# Patient Record
Sex: Male | Born: 1985 | Race: Black or African American | Hispanic: No | Marital: Married | Smoking: Never smoker
Health system: Southern US, Community
[De-identification: ages and names within clinical notes are randomized; demographics above are authoritative.]

## PROBLEM LIST (undated history)

## (undated) DIAGNOSIS — I10 Essential (primary) hypertension: Secondary | ICD-10-CM

## (undated) DIAGNOSIS — I493 Ventricular premature depolarization: Secondary | ICD-10-CM

---

## 2011-09-23 ENCOUNTER — Encounter (HOSPITAL_COMMUNITY): Payer: Self-pay | Admitting: *Deleted

## 2011-09-23 ENCOUNTER — Emergency Department (HOSPITAL_COMMUNITY)
Admission: EM | Admit: 2011-09-23 | Discharge: 2011-09-23 | Disposition: A | Discharge status: Home / Self Care | Payer: No Typology Code available for payment source | Attending: Emergency Medicine | Admitting: Emergency Medicine

## 2011-09-23 ENCOUNTER — Emergency Department (HOSPITAL_COMMUNITY): Payer: No Typology Code available for payment source

## 2011-09-23 DIAGNOSIS — T148XXA Other injury of unspecified body region, initial encounter: Secondary | ICD-10-CM

## 2011-09-23 DIAGNOSIS — Z043 Encounter for examination and observation following other accident: Secondary | ICD-10-CM | POA: Insufficient documentation

## 2011-09-23 DIAGNOSIS — IMO0001 Reserved for inherently not codable concepts without codable children: Secondary | ICD-10-CM | POA: Insufficient documentation

## 2011-09-23 LAB — URINALYSIS, ROUTINE W REFLEX MICROSCOPIC
Glucose, UA: NEGATIVE mg/dL
Ketones, ur: NEGATIVE mg/dL
Leukocytes, UA: NEGATIVE
Nitrite: NEGATIVE
Protein, ur: NEGATIVE mg/dL
Urobilinogen, UA: 0.2 mg/dL (ref 0.0–1.0)

## 2011-09-23 MED ORDER — CYCLOBENZAPRINE HCL 10 MG PO TABS: 10.0000 mg | ORAL_TABLET | Freq: Two times a day (BID) | ORAL | Status: AC | PRN

## 2011-09-23 MED ORDER — HYDROCODONE-ACETAMINOPHEN 5-325 MG PO TABS: 1.0000 | ORAL_TABLET | ORAL | Status: AC | PRN

## 2011-09-23 MED ORDER — IBUPROFEN 800 MG PO TABS: 800.0000 mg | ORAL_TABLET | Freq: Three times a day (TID) | ORAL | Status: AC

## 2011-09-23 NOTE — ED Notes (Signed)
Patient given discharge instructions, information, prescriptions, and diet order. Patient states that they adequately understand discharge information given and to return to ED if symptoms return or worsen.     
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
## 2011-09-24 NOTE — ED Provider Notes (Signed)
Medical screening examination/treatment/procedure(s) were performed by non-physician practitioner and as supervising physician I was immediately available for consultation/collaboration.   Kailany Dinunzio M Jigar Zielke, MD 09/24/11 0113 

## 2016-05-16 ENCOUNTER — Emergency Department (HOSPITAL_COMMUNITY)
Admission: EM | Admit: 2016-05-16 | Discharge: 2016-05-16 | Disposition: A | Discharge status: Home / Self Care | Payer: 59 | Attending: Emergency Medicine | Admitting: Emergency Medicine

## 2016-05-16 ENCOUNTER — Encounter (HOSPITAL_COMMUNITY): Payer: Self-pay | Admitting: Emergency Medicine

## 2016-05-16 ENCOUNTER — Emergency Department (HOSPITAL_COMMUNITY): Payer: 59

## 2016-05-16 DIAGNOSIS — M542 Cervicalgia: Secondary | ICD-10-CM | POA: Insufficient documentation

## 2016-05-16 DIAGNOSIS — Z79899 Other long term (current) drug therapy: Secondary | ICD-10-CM | POA: Insufficient documentation

## 2016-05-16 DIAGNOSIS — R0789 Other chest pain: Secondary | ICD-10-CM | POA: Insufficient documentation

## 2016-05-16 DIAGNOSIS — I10 Essential (primary) hypertension: Secondary | ICD-10-CM | POA: Diagnosis not present

## 2016-05-16 DIAGNOSIS — M25512 Pain in left shoulder: Secondary | ICD-10-CM | POA: Diagnosis not present

## 2016-05-16 DIAGNOSIS — R079 Chest pain, unspecified: Secondary | ICD-10-CM | POA: Diagnosis present

## 2016-05-16 HISTORY — DX: Essential (primary) hypertension: I10

## 2016-05-16 HISTORY — DX: Ventricular premature depolarization: I49.3

## 2016-05-16 LAB — CBC
HCT: 46.4 % (ref 39.0–52.0)
Hemoglobin: 15.3 g/dL (ref 13.0–17.0)
MCH: 23.9 pg — AB (ref 26.0–34.0)
MCHC: 33 g/dL (ref 30.0–36.0)
MCV: 72.6 fL — AB (ref 78.0–100.0)
Platelets: 234 10*3/uL (ref 150–400)
RBC: 6.39 MIL/uL — AB (ref 4.22–5.81)
RDW: 14.9 % (ref 11.5–15.5)
WBC: 8.2 10*3/uL (ref 4.0–10.5)

## 2016-05-16 LAB — BASIC METABOLIC PANEL
Anion gap: 12 (ref 5–15)
BUN: 13 mg/dL (ref 6–20)
CHLORIDE: 101 mmol/L (ref 101–111)
CO2: 27 mmol/L (ref 22–32)
Calcium: 9.3 mg/dL (ref 8.9–10.3)
Creatinine, Ser: 1.04 mg/dL (ref 0.61–1.24)
GFR calc non Af Amer: >60 mL/min (ref >60)
Glucose, Bld: 84 mg/dL (ref 65–99)
POTASSIUM: 3.6 mmol/L (ref 3.5–5.1)
SODIUM: 140 mmol/L (ref 135–145)

## 2016-05-16 LAB — TROPONIN I: Troponin I: <0.03 ng/mL (ref <0.03)

## 2016-05-16 MED ORDER — IBUPROFEN 600 MG PO TABS: 600.0000 mg | ORAL_TABLET | Freq: Four times a day (QID) | ORAL | 0 refills | Status: AC | PRN

## 2016-05-16 NOTE — ED Triage Notes (Signed)
Pt c/o 6/10 left side cp radiating to left arm and neck that started today, denies any SOB, nausea or vomiting. Pt Ao x4 NAD noticed.

## 2016-05-16 NOTE — Discharge Instructions (Signed)
Take Ibuprofen three times daily as needed Follow up with your doctor

## 2016-05-16 NOTE — ED Provider Notes (Signed)
[facility]-EMERGENCY DEPT Provider Note   CSN: [id number] Arrival date & time: 05/16/16  0325     History   Chief Complaint Chief Complaint  Patient presents with  . Chest Pain    HPI Aaron Clayton is a 31 y.o. male who presents with chest pain, neck pain, L arm pain. PMH significant for obesity and hx of PVCs. He states that yesterday he first noticed the pain at about 2PM while he was standing in line at the store. The pain is mostly on the left upper side of his chest, left neck, and inside of his left upper arm. It is achy but sometimes sharp as well. Lying down and movement make the pain better. Nothing has made it worse. He has not tried any medicines. No fever, cough, SOB, palpitations, leg swelling, abdominal pain, N/V. His mother has hx of heart murmur and HTN and his father has heart disease. 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  HPI  Past Medical History:  Diagnosis Date  . Hypertension   . PVC (premature ventricular contraction)     There are no active problems to display for this patient.   History reviewed. No pertinent surgical history.     Home Medications    Prior to Admission medications   Medication Sig Start Date End Date Taking? Authorizing Provider  acetaminophen (TYLENOL) 500 MG tablet Take 500 mg by mouth every 6 (six) hours as needed for mild pain.    Yes Historical Provider, MD  cetirizine (ZYRTEC) 10 MG tablet Take 10 mg by mouth daily as needed for allergies.   Yes Historical Provider, MD  diphenhydrAMINE (BENADRYL) 25 mg capsule Take 25 mg by mouth every 6 (six) hours as needed for allergies.    Yes Historical Provider, MD  loratadine (CLARITIN) 10 MG tablet Take 10 mg by mouth daily as needed for allergies.    Yes Historical Provider, MD    Family History History reviewed. No pertinent family history.  Social History Social History  Substance Use Topics  . Smoking status: Never Smoker  . Smokeless tobacco: Never Used  .  Alcohol use Yes     Comment: socially     Allergies   Patient has no known allergies.   Review of Systems Review of Systems  Constitutional: Negative for chills and fever.  Respiratory: Negative for shortness of breath.   Cardiovascular: Positive for chest pain. Negative for palpitations and leg swelling.  Gastrointestinal: Negative for abdominal pain, nausea and vomiting.  Musculoskeletal: Positive for myalgias and neck pain.  All other systems reviewed and are negative.   Physical Exam Updated Vital Signs BP 124/79 (BP Location: Right Arm)   Pulse 62   Temp 98.2 F (36.8 C) (Oral)   Resp (!) 22   Ht 6' (1.829 m)   Wt 133.8 kg   SpO2 99%   BMI 40.01 kg/m   Physical Exam  Constitutional: He is oriented to person, place, and time. He appears well-developed and well-nourished. No distress.  HENT:  Head: Normocephalic and atraumatic.  Eyes: Conjunctivae are normal. Pupils are equal, round, and reactive to light. Right eye exhibits no discharge. Left eye exhibits no discharge. No scleral icterus.  Neck: Normal range of motion.  Left sided neck tenderness  Cardiovascular: Normal rate and regular rhythm.  Exam reveals no gallop and no friction rub.   No murmur heard. Pulmonary/Chest: Effort normal and breath sounds normal. No respiratory distress. He has no wheezes. He  has no rales. He exhibits tenderness (Tender to left upper chest).  Abdominal: Soft. Bowel sounds are normal. He exhibits no distension and no mass. There is no tenderness. There is no rebound and no guarding. No hernia.  Musculoskeletal:  Left anterior shoulder and arm tenderness. FROM of shoulder and elbow. 2+ radial pulse bilaterally  Neurological: He is alert and oriented to person, place, and time.  Skin: Skin is warm and dry.  Psychiatric: He has a normal mood and affect. His behavior is normal.  Nursing note and vitals reviewed.   ED Treatments / Results  Labs (all labs ordered are listed, but only  abnormal results are displayed) Labs Reviewed  CBC - Abnormal; Notable for the following:       Result Value   RBC 6.39 (*)    MCV 72.6 (*)    MCH 23.9 (*)    All other components within normal limits  BASIC METABOLIC PANEL  TROPONIN I    EKG  EKG Interpretation  Date/Time:  Saturday May 16 2016 03:40:42 EDT Ventricular Rate:  81 PR Interval:  174 QRS Duration: 104 QT Interval:  382 QTC Calculation: 443 R Axis:   6 Text Interpretation:  Normal sinus rhythm Normal ECG No old tracing to compare Confirmed by GOLDSTON MD, SCOTT [phone]) on 05/16/2016 5:57:11 AM       Radiology Dg Chest 2 View  Result Date: 05/16/2016 CLINICAL DATA:  LEFT neck pain radiating to arm for a few hours. Chest pain. EXAM: CHEST  2 VIEW COMPARISON:  Chest radiograph September 23, 2011 FINDINGS: Cardiomediastinal silhouette is normal. No pleural effusions or focal consolidations. Trachea projects midline and there is no pneumothorax. Soft tissue planes and included osseous structures are non-suspicious. Large body habitus. IMPRESSION: Normal chest. Electronically Signed   By: Awilda Metro M.D.   On: 05/16/2016 04:00    Procedures Procedures (including critical care time)  Medications Ordered in ED Medications - No data to display   Initial Impression / Assessment and Plan / ED Course  I have reviewed the triage vital signs and the nursing notes.  Pertinent labs & imaging results that were available during my care of the patient were reviewed by me and considered in my medical decision making (see chart for details).  31 year old male with chest pain likely MSK in nature. Chest pain work up is reassuring. Doubt ACS, PE, pericarditis, esophageal rupture, tension pneumothorax, aortic dissection, cardiac tamponade. EKG is NSR. CXR is negative. Initial  troponin is 0. Since pain is obviously MSK related and has been constant for >12 hours will not repeat troponin. Labs are unremarkable. Recommend  NSAIDs as needed and follow up with PCP. Return precautions given.  Final Clinical Impressions(s) / ED Diagnoses   Final diagnoses:  Chest wall pain  Neck pain on left side  Acute pain of left shoulder    New Prescriptions New Prescriptions   No medications on file     Bethel Born, PA-C 05/16/16 1026    Pricilla Loveless, MD 05/16/16 2156

## 2016-05-16 NOTE — ED Notes (Signed)
Pt voices understanding of discharge instructions. NAD at discharge. Ambulatory to waiting room.

## 2016-05-27 DIAGNOSIS — S61211A Laceration without foreign body of left index finger without damage to nail, initial encounter: Secondary | ICD-10-CM | POA: Diagnosis not present

## 2016-06-05 DIAGNOSIS — S61412D Laceration without foreign body of left hand, subsequent encounter: Secondary | ICD-10-CM | POA: Diagnosis not present

## 2016-08-04 DIAGNOSIS — R229 Localized swelling, mass and lump, unspecified: Secondary | ICD-10-CM | POA: Diagnosis not present

## 2016-08-07 DIAGNOSIS — Z0100 Encounter for examination of eyes and vision without abnormal findings: Secondary | ICD-10-CM | POA: Diagnosis not present

## 2016-08-26 DIAGNOSIS — Q892 Congenital malformations of other endocrine glands: Secondary | ICD-10-CM | POA: Diagnosis not present

## 2016-09-11 DIAGNOSIS — Q892 Congenital malformations of other endocrine glands: Secondary | ICD-10-CM | POA: Diagnosis not present

## 2016-09-11 DIAGNOSIS — Z0000 Encounter for general adult medical examination without abnormal findings: Secondary | ICD-10-CM | POA: Diagnosis not present

## 2016-09-11 DIAGNOSIS — J309 Allergic rhinitis, unspecified: Secondary | ICD-10-CM | POA: Diagnosis not present

## 2016-09-25 DIAGNOSIS — E782 Mixed hyperlipidemia: Secondary | ICD-10-CM | POA: Diagnosis not present

## 2016-10-02 DIAGNOSIS — Z0000 Encounter for general adult medical examination without abnormal findings: Secondary | ICD-10-CM | POA: Diagnosis not present

## 2016-11-05 ENCOUNTER — Other Ambulatory Visit: Payer: Self-pay | Admitting: Otolaryngology

## 2016-11-05 ENCOUNTER — Other Ambulatory Visit (HOSPITAL_COMMUNITY)
Admission: RE | Admit: 2016-11-05 | Discharge: 2016-11-05 | Disposition: A | Discharge status: Home / Self Care | Payer: 59 | Source: Ambulatory Visit | Attending: Otolaryngology | Admitting: Otolaryngology

## 2016-11-05 DIAGNOSIS — R590 Localized enlarged lymph nodes: Secondary | ICD-10-CM | POA: Insufficient documentation

## 2016-11-05 DIAGNOSIS — R599 Enlarged lymph nodes, unspecified: Secondary | ICD-10-CM | POA: Diagnosis not present

## 2017-03-22 DIAGNOSIS — J069 Acute upper respiratory infection, unspecified: Secondary | ICD-10-CM | POA: Diagnosis not present

## 2017-04-02 DIAGNOSIS — I10 Essential (primary) hypertension: Secondary | ICD-10-CM | POA: Diagnosis not present

## 2017-04-02 DIAGNOSIS — J0100 Acute maxillary sinusitis, unspecified: Secondary | ICD-10-CM | POA: Diagnosis not present

## 2017-04-02 DIAGNOSIS — J069 Acute upper respiratory infection, unspecified: Secondary | ICD-10-CM | POA: Diagnosis not present

## 2017-04-13 DIAGNOSIS — J018 Other acute sinusitis: Secondary | ICD-10-CM | POA: Diagnosis not present

## 2017-05-11 DIAGNOSIS — J0100 Acute maxillary sinusitis, unspecified: Secondary | ICD-10-CM | POA: Diagnosis not present

## 2017-05-11 DIAGNOSIS — I10 Essential (primary) hypertension: Secondary | ICD-10-CM | POA: Diagnosis not present

## 2017-05-22 ENCOUNTER — Encounter (HOSPITAL_COMMUNITY): Payer: Self-pay | Admitting: Emergency Medicine

## 2017-05-22 ENCOUNTER — Other Ambulatory Visit: Payer: Self-pay

## 2017-05-22 ENCOUNTER — Emergency Department (HOSPITAL_COMMUNITY)
Admission: EM | Admit: 2017-05-22 | Discharge: 2017-05-22 | Disposition: A | Discharge status: Home / Self Care | Payer: 59 | Attending: Emergency Medicine | Admitting: Emergency Medicine

## 2017-05-22 DIAGNOSIS — T464X5A Adverse effect of angiotensin-converting-enzyme inhibitors, initial encounter: Secondary | ICD-10-CM

## 2017-05-22 DIAGNOSIS — R112 Nausea with vomiting, unspecified: Secondary | ICD-10-CM | POA: Diagnosis present

## 2017-05-22 DIAGNOSIS — I10 Essential (primary) hypertension: Secondary | ICD-10-CM | POA: Diagnosis not present

## 2017-05-22 DIAGNOSIS — N179 Acute kidney failure, unspecified: Secondary | ICD-10-CM

## 2017-05-22 DIAGNOSIS — Z79899 Other long term (current) drug therapy: Secondary | ICD-10-CM | POA: Diagnosis not present

## 2017-05-22 DIAGNOSIS — E860 Dehydration: Secondary | ICD-10-CM

## 2017-05-22 DIAGNOSIS — M6282 Rhabdomyolysis: Secondary | ICD-10-CM | POA: Diagnosis not present

## 2017-05-22 LAB — URINALYSIS, ROUTINE W REFLEX MICROSCOPIC
Bacteria, UA: NONE SEEN
Bilirubin Urine: NEGATIVE
Glucose, UA: NEGATIVE mg/dL
Hgb urine dipstick: NEGATIVE
Ketones, ur: 5 mg/dL — AB
Leukocytes, UA: NEGATIVE
Nitrite: NEGATIVE
Protein, ur: 30 mg/dL — AB
Specific Gravity, Urine: 1.015 (ref 1.005–1.030)
pH: 5 (ref 5.0–8.0)

## 2017-05-22 LAB — COMPREHENSIVE METABOLIC PANEL WITH GFR
ALT: 18 U/L (ref 17–63)
AST: 22 U/L (ref 15–41)
Albumin: 4.6 g/dL (ref 3.5–5.0)
Alkaline Phosphatase: 65 U/L (ref 38–126)
Anion gap: 18 — ABNORMAL HIGH (ref 5–15)
BUN: 26 mg/dL — ABNORMAL HIGH (ref 6–20)
CO2: 17 mmol/L — ABNORMAL LOW (ref 22–32)
Calcium: 9.9 mg/dL (ref 8.9–10.3)
Chloride: 96 mmol/L — ABNORMAL LOW (ref 101–111)
Creatinine, Ser: 2.31 mg/dL — ABNORMAL HIGH (ref 0.61–1.24)
GFR calc Af Amer: 42 mL/min — ABNORMAL LOW
GFR calc non Af Amer: 36 mL/min — ABNORMAL LOW
Glucose, Bld: 114 mg/dL — ABNORMAL HIGH (ref 65–99)
Potassium: 3.9 mmol/L (ref 3.5–5.1)
Sodium: 131 mmol/L — ABNORMAL LOW (ref 135–145)
Total Bilirubin: 1.2 mg/dL (ref 0.3–1.2)
Total Protein: 8.3 g/dL — ABNORMAL HIGH (ref 6.5–8.1)

## 2017-05-22 LAB — BASIC METABOLIC PANEL
ANION GAP: 10 (ref 5–15)
BUN: 27 mg/dL — ABNORMAL HIGH (ref 6–20)
CALCIUM: 8.7 mg/dL — AB (ref 8.9–10.3)
CO2: 23 mmol/L (ref 22–32)
CREATININE: 1.87 mg/dL — AB (ref 0.61–1.24)
Chloride: 102 mmol/L (ref 101–111)
GFR, EST AFRICAN AMERICAN: 54 mL/min — AB (ref >60)
GFR, EST NON AFRICAN AMERICAN: 47 mL/min — AB (ref >60)
Glucose, Bld: 123 mg/dL — ABNORMAL HIGH (ref 65–99)
Potassium: 4.2 mmol/L (ref 3.5–5.1)
SODIUM: 135 mmol/L (ref 135–145)

## 2017-05-22 LAB — CBC
HCT: 46.1 % (ref 39.0–52.0)
Hemoglobin: 15.5 g/dL (ref 13.0–17.0)
MCH: 23.8 pg — ABNORMAL LOW (ref 26.0–34.0)
MCHC: 33.6 g/dL (ref 30.0–36.0)
MCV: 70.9 fL — ABNORMAL LOW (ref 78.0–100.0)
Platelets: 266 K/uL (ref 150–400)
RBC: 6.5 MIL/uL — ABNORMAL HIGH (ref 4.22–5.81)
RDW: 14.6 % (ref 11.5–15.5)
WBC: 9.7 K/uL (ref 4.0–10.5)

## 2017-05-22 LAB — CK: CK TOTAL: 439 U/L — AB (ref 49–397)

## 2017-05-22 LAB — LIPASE, BLOOD: Lipase: 24 U/L (ref 11–51)

## 2017-05-22 MED ORDER — SODIUM CHLORIDE 0.9 % IV BOLUS (SEPSIS)
1000.0000 mL | Freq: Once | INTRAVENOUS | Status: AC
  Administered 2017-05-22: 1000 mL via INTRAVENOUS

## 2017-05-22 MED ORDER — PANTOPRAZOLE SODIUM 20 MG PO TBEC
20.0000 mg | DELAYED_RELEASE_TABLET | Freq: Once | ORAL | Status: AC
  Administered 2017-05-22: 20 mg via ORAL
  Filled 2017-05-22 (×2): qty 1

## 2017-05-22 MED ORDER — LACTATED RINGERS IV BOLUS
1000.0000 mL | Freq: Once | INTRAVENOUS | Status: AC
  Administered 2017-05-22: 1000 mL via INTRAVENOUS

## 2017-05-22 MED ORDER — ASPIRIN 81 MG PO CHEW
324.0000 mg | CHEWABLE_TABLET | Freq: Once | ORAL | Status: AC
  Administered 2017-05-22: 324 mg via ORAL
  Filled 2017-05-22: qty 4

## 2017-05-22 MED ORDER — AMLODIPINE BESYLATE 5 MG PO TABS: 5.0000 mg | ORAL_TABLET | Freq: Every day | ORAL | 0 refills | Status: AC

## 2017-05-22 MED ORDER — SODIUM CHLORIDE 0.9 % IV SOLN: 1000.0000 mL | INTRAVENOUS | Status: DC

## 2017-05-22 NOTE — ED Triage Notes (Signed)
Pt. Stated, I started throwing up with nausea with stomach cramps

## 2017-05-22 NOTE — Discharge Instructions (Signed)
1.  You are to discontinue lisinopril at this time.  If your blood pressures start to get greater than 130s over 80s, start amlodipine 5 mg daily. 2.  You will need to rest and stay out of the heat.  No physically exerting activities for the next 2 days.  He will have to follow-up with your family doctor and have a recheck on your kidney function and blood pressure before resuming normal physical exertion. 3.  Return to the emergency department if you develop weakness, pain or other concerning symptoms.

## 2017-05-22 NOTE — ED Provider Notes (Signed)
[facility] EMERGENCY DEPARTMENT Provider Note   CSN: [id number] Arrival date & time: 05/22/17  1345     History   Chief Complaint Chief Complaint  Patient presents with  . Emesis  . Nausea  . Abdominal Pain  . Back Pain    HPI Aaron Clayton is a 32 y.o. male.  HPI Reports he felt fine this morning.  He had played a game of tennis.  He and his wife are getting ready to go to a party.  He quite quickly became nauseated and started having muscle cramps.  He vomited.  He then went on to have continued cramping muscles and general weakness.  No fever.  No diarrhea.  Patient denies any symptoms leading up to this.  He does take lisinopril for hypertension.  He reports he is compliant with medications.  No regular alcohol use.  No significant use within the past several days.  No tobacco or other drug use. Past Medical History:  Diagnosis Date  . Hypertension   . PVC (premature ventricular contraction)     There are no active problems to display for this patient.   History reviewed. No pertinent surgical history.      Home Medications    Prior to Admission medications   Medication Sig Start Date End Date Taking? Authorizing Provider  acetaminophen (TYLENOL) 500 MG tablet Take 500 mg by mouth every 6 (six) hours as needed for mild pain.    Yes [provider]  cetirizine (ZYRTEC) 10 MG tablet Take 10 mg by mouth daily as needed for allergies.   Yes [provider]  diphenhydrAMINE (BENADRYL) 25 mg capsule Take 25 mg by mouth every 6 (six) hours as needed for allergies.    Yes [provider]  ibuprofen (ADVIL,MOTRIN) 600 MG tablet Take 1 tablet (600 mg total) by mouth every 6 (six) hours as needed. 05/16/16  Yes Bethel Born, PA-C  lisinopril (PRINIVIL,ZESTRIL) 20 MG tablet Take 20 mg by mouth daily. 05/11/17  Yes [provider]  amLODipine (NORVASC) 5 MG tablet Take 1 tablet (5 mg total) by mouth daily. 05/22/17    Arby Barrette, MD    Family History No family history on file.  Social History Social History   Tobacco Use  . Smoking status: Never Smoker  . Smokeless tobacco: Never Used  Substance Use Topics  . Alcohol use: Yes    Comment: socially  . Drug use: No     Allergies   Patient has no known allergies.   Review of Systems Review of Systems 10 Systems reviewed and are negative for acute change except as noted in the HPI.   Physical Exam Updated Vital Signs BP (!) 112/52   Pulse 100   Temp 98 F (36.7 C) (Oral)   Resp 16   Ht  (1.854 m)   Wt (!) 148.3 kg (327 lb)   SpO2 97%   BMI 43.14 kg/m   Physical Exam  Constitutional: He is oriented to person, place, and time. He appears well-developed and well-nourished.  Patient appears uncomfortable with cramping in his legs.  He is grimacing and holding his thigh.  No respiratory distress.  Mental status clear.  HENT:  Head: Normocephalic and atraumatic.  Mucous membranes slightly dry.  Eyes: EOM are normal.  Neck: Neck supple.  Cardiovascular: Normal rate, normal heart sounds and intact distal pulses.  Borderline tachycardia.  Pulmonary/Chest: Effort normal and breath sounds normal.  Abdominal: Soft. Bowel sounds are normal.  He exhibits no distension. There is tenderness.  Abdomen diffusely tender without guarding.  Musculoskeletal: Normal range of motion. He exhibits no edema or tenderness.  Neurological: He is alert and oriented to person, place, and time. He has normal strength. He exhibits normal muscle tone. Coordination normal. GCS eye subscore is 4. GCS verbal subscore is 5. GCS motor subscore is 6.  Skin: Skin is warm, dry and intact.  Psychiatric: He has a normal mood and affect.     ED Treatments / Results  Labs (all labs ordered are listed, but only abnormal results are displayed) Labs Reviewed  COMPREHENSIVE METABOLIC PANEL - Abnormal; Notable for the following components:      Result Value    Sodium 131 (*)    Chloride 96 (*)    CO2 17 (*)    Glucose, Bld 114 (*)    BUN 26 (*)    Creatinine, Ser 2.31 (*)    Total Protein 8.3 (*)    GFR calc non Af Amer 36 (*)    GFR calc Af Amer 42 (*)    Anion gap 18 (*)    All other components within normal limits  CBC - Abnormal; Notable for the following components:   RBC 6.50 (*)    MCV 70.9 (*)    MCH 23.8 (*)    All other components within normal limits  URINALYSIS, ROUTINE W REFLEX MICROSCOPIC - Abnormal; Notable for the following components:   APPearance CLOUDY (*)    Ketones, ur 5 (*)    Protein, ur 30 (*)    All other components within normal limits  CK - Abnormal; Notable for the following components:   Total CK 439 (*)    All other components within normal limits  BASIC METABOLIC PANEL - Abnormal; Notable for the following components:   Glucose, Bld 123 (*)    BUN 27 (*)    Creatinine, Ser 1.87 (*)    Calcium 8.7 (*)    GFR calc non Af Amer 47 (*)    GFR calc Af Amer 54 (*)    All other components within normal limits  LIPASE, BLOOD    EKG None  Radiology No results found.  Procedures Procedures (including critical care time) CRITICAL CARE Performed by: Cristy Friedlander   Total critical care time: 30  minutes  Critical care time was exclusive of separately billable procedures and treating other patients.  Critical care was necessary to treat or prevent imminent or life-threatening deterioration.  Critical care was time spent personally by me on the following activities: development of treatment plan with patient and/or surrogate as well as nursing, discussions with consultants, evaluation of patient's response to treatment, examination of patient, obtaining history from patient or surrogate, ordering and performing treatments and interventions, ordering and review of laboratory studies, ordering and review of radiographic studies, pulse oximetry and re-evaluation of patient's condition. Medications Ordered  in ED Medications  sodium chloride 0.9 % bolus 1,000 mL (0 mLs Intravenous Stopped 05/22/17 1854)    Followed by  sodium chloride 0.9 % bolus 1,000 mL (0 mLs Intravenous Stopped 05/22/17 1854)    Followed by  0.9 %  sodium chloride infusion (has no administration in time range)  lactated ringers bolus 1,000 mL (0 mLs Intravenous Stopped 05/22/17 1707)  pantoprazole (PROTONIX) EC tablet 20 mg (20 mg Oral Given 05/22/17 1824)  aspirin chewable tablet 324 mg (324 mg Oral Given 05/22/17 1825)     Initial Impression / Assessment and Plan /  ED Course  I have reviewed the triage vital signs and the nursing notes.  Pertinent labs & imaging results that were available during my care of the patient were reviewed by me and considered in my medical decision making (see chart for details).  Clinical Course as of May 23 1950  Sat May 22, 2017  1639 Patient has had greater than three quarters of his first liter of fluids.  He reports he is feeling improved.  Cramping is abating.   [MP]  1857 And is very much improved.  He is alert and comfortable.  He has been able to eat and drink.  Blood pressure is now normotensive.   [MP]    Clinical Course User Index [MP] Arby Barrette, MD     Final Clinical Impressions(s) / ED Diagnoses   Final diagnoses:  AKI (acute kidney injury) (HCC)  Dehydration  Adverse effect of lisinopril, initial encounter  Non-traumatic rhabdomyolysis  Patient presented with fairly rapid onset of muscle cramps.  He was very uncomfortable.  Patient had played tennis yesterday and today.  He reports he is normally fairly active and this is not atypical.  Temperatures however have been warm.  He also takes lisinopril.  Diagnostic studies indicate dehydration with acute kidney injury and very mild rhabdomyolysis.  Patient began responding very quickly to fluid hydration.  He improved symptomatically.  After treatment patient is very comfortable without any residual cramping or weakness.   Renal function has improved.  CK elevation is very mild.  This time I feel patient stable for continued home management.  He will discontinue lisinopril.  He will continue to monitor his blood pressure.  He will not resume antihypertensive medication unless blood pressures are getting greater than 130 systolic.  At that time amlodipine is provided as a new medication.  Patient is aware to avoid all physical exertion and heat until his follow-up.  He is advised he needs recheck on his lab work and blood pressure with his PCP by the beginning of the week.  Return precautions reviewed.  ED Discharge Orders        Ordered    amLODipine (NORVASC) 5 MG tablet  Daily     05/22/17 1949       Arby Barrette, MD 05/22/17 [phone]

## 2017-05-24 DIAGNOSIS — N179 Acute kidney failure, unspecified: Secondary | ICD-10-CM | POA: Diagnosis not present

## 2017-05-24 DIAGNOSIS — M6282 Rhabdomyolysis: Secondary | ICD-10-CM | POA: Diagnosis not present

## 2017-05-24 DIAGNOSIS — E860 Dehydration: Secondary | ICD-10-CM | POA: Diagnosis not present

## 2017-06-11 ENCOUNTER — Encounter: Payer: Self-pay | Admitting: Registered"

## 2017-06-11 ENCOUNTER — Encounter: Payer: 59 | Attending: Family Medicine | Admitting: Registered"

## 2017-06-11 DIAGNOSIS — E669 Obesity, unspecified: Secondary | ICD-10-CM | POA: Insufficient documentation

## 2017-06-11 DIAGNOSIS — Z713 Dietary counseling and surveillance: Secondary | ICD-10-CM | POA: Diagnosis not present

## 2017-06-11 NOTE — Progress Notes (Signed)
Medical Nutrition Therapy:  Appt start time: 0830 end time:  0900.   Assessment:  Primary concerns today: Pt states 2 yrs ago he lost 100 lbs but has since gained it back. Patient states he understands that it is healthier to achieve weight loss slowly. Pt states he is also concerned about recently diagnosed HTN. Pt states when he drasticly cut out salt without a change in medication in addition to potential dehydration may have been the cause of recent kidney injury. Pt states issue has been resolved.   Pt states he and his wife mostly prepare dinner from scratch, sometimes use prepared meals for convenience.   Pt states he has variable sleep schedule and on the weekend may sleep until noon. Pt states he has a desire to be more of a morning person. Pt states part of the reason he stays up late (1-2 am) is because he works out late after work.   Pt states he works a 9-5 schedule so it is very reasonable for him to get into more of a routine.  Preferred Learning Style:   No preference indicated   Learning Readiness:   Ready  MEDICATIONS: reviewed   DIETARY INTAKE:  24-hr recall:  B ( AM): granola bar or kind bar  Snk ( AM): nuts OR plain bagel OR dry cereal L ( PM): left overs OR panera OR chick fil-a, grilled nugget, fries, supper food Snk ( PM): same as morning OR fruit or avocado D (6 PM): burger (no bread), fries OR pasta OR pork chops  Snk ( PM): protein shake after work out - core life (24 g pro) Beverages: water  Usual physical activity: gym 3-4 times week, 60 min:  (Mon: cardio 45 min, stretching 15 min. Other days will do cardio 15 min, weights ~30, stretching)  Estimated energy needs: 2200 calories 248 g carbohydrates 138 g protein 73 g fat  Progress Towards Goal(s):  New goal.   Nutritional Diagnosis:  NB-1.1 Food and nutrition-related knowledge deficit As related to balanced meals.  As evidenced by dietary recall and pt states lack of knowledge.     Intervention:  Nutrition Education. Discussed balanced eating. Discussed purpose of snacks. Discussed importance of building a good sleep routine. Discussed hormonal influence over body size.  Plan: Aim to eat 3 balanced meals per day and snacks if your hungry. Consider eating mindfully without distracts and listen to your body's cues. Be mindful of the containers you use in the microwave. Continue your exercise routine including cardio, weights and stretching. Consider getting in a regular routine for sleep even on weekends. (patient states he is going to start with sleeping routine)  Teaching Method Utilized:  Visual Auditory  Handouts given during visit include:  MyPlate Planner  Barriers to learning/adherence to lifestyle change: none  Demonstrated degree of understanding via:  Teach Back   Monitoring/Evaluation:  Dietary intake, exercise, and body weight in 4 week(s).

## 2017-06-11 NOTE — Patient Instructions (Signed)
Aim to eat 3 balanced meals per day and snacks if your hungry. Consider eating mindfully without distracts and listen to your body's cues. Be mindful of the containers you use in the microwave. Continue your exercise routine including cardio, weights and stretching. Consider getting in a regular routine for sleep even on weekends.

## 2017-06-21 DIAGNOSIS — I10 Essential (primary) hypertension: Secondary | ICD-10-CM | POA: Diagnosis not present

## 2017-06-21 DIAGNOSIS — N179 Acute kidney failure, unspecified: Secondary | ICD-10-CM | POA: Diagnosis not present

## 2017-06-21 DIAGNOSIS — J309 Allergic rhinitis, unspecified: Secondary | ICD-10-CM | POA: Diagnosis not present

## 2017-06-28 DIAGNOSIS — M5442 Lumbago with sciatica, left side: Secondary | ICD-10-CM | POA: Diagnosis not present

## 2017-06-28 DIAGNOSIS — I10 Essential (primary) hypertension: Secondary | ICD-10-CM | POA: Diagnosis not present

## 2017-08-13 DIAGNOSIS — Z0100 Encounter for examination of eyes and vision without abnormal findings: Secondary | ICD-10-CM | POA: Diagnosis not present

## 2017-10-08 DIAGNOSIS — Z0000 Encounter for general adult medical examination without abnormal findings: Secondary | ICD-10-CM | POA: Diagnosis not present

## 2017-10-12 DIAGNOSIS — I10 Essential (primary) hypertension: Secondary | ICD-10-CM | POA: Diagnosis not present

## 2017-10-12 DIAGNOSIS — Z6841 Body Mass Index (BMI) 40.0 and over, adult: Secondary | ICD-10-CM | POA: Diagnosis not present

## 2017-10-15 DIAGNOSIS — J302 Other seasonal allergic rhinitis: Secondary | ICD-10-CM | POA: Diagnosis not present

## 2017-10-15 DIAGNOSIS — Z23 Encounter for immunization: Secondary | ICD-10-CM | POA: Diagnosis not present

## 2017-10-15 DIAGNOSIS — Z0000 Encounter for general adult medical examination without abnormal findings: Secondary | ICD-10-CM | POA: Diagnosis not present

## 2017-10-15 DIAGNOSIS — I10 Essential (primary) hypertension: Secondary | ICD-10-CM | POA: Diagnosis not present

## 2017-10-21 DIAGNOSIS — Z713 Dietary counseling and surveillance: Secondary | ICD-10-CM | POA: Diagnosis not present

## 2018-09-27 IMAGING — DX DG CHEST 2V
2 series · 2 of 2 positions shown · non-contrast
Comparison: Chest radiograph September 23, 2011

CLINICAL DATA: LEFT neck pain radiating to arm for a few hours.
Chest pain.

EXAM:
CHEST  2 VIEW

[chest pa]
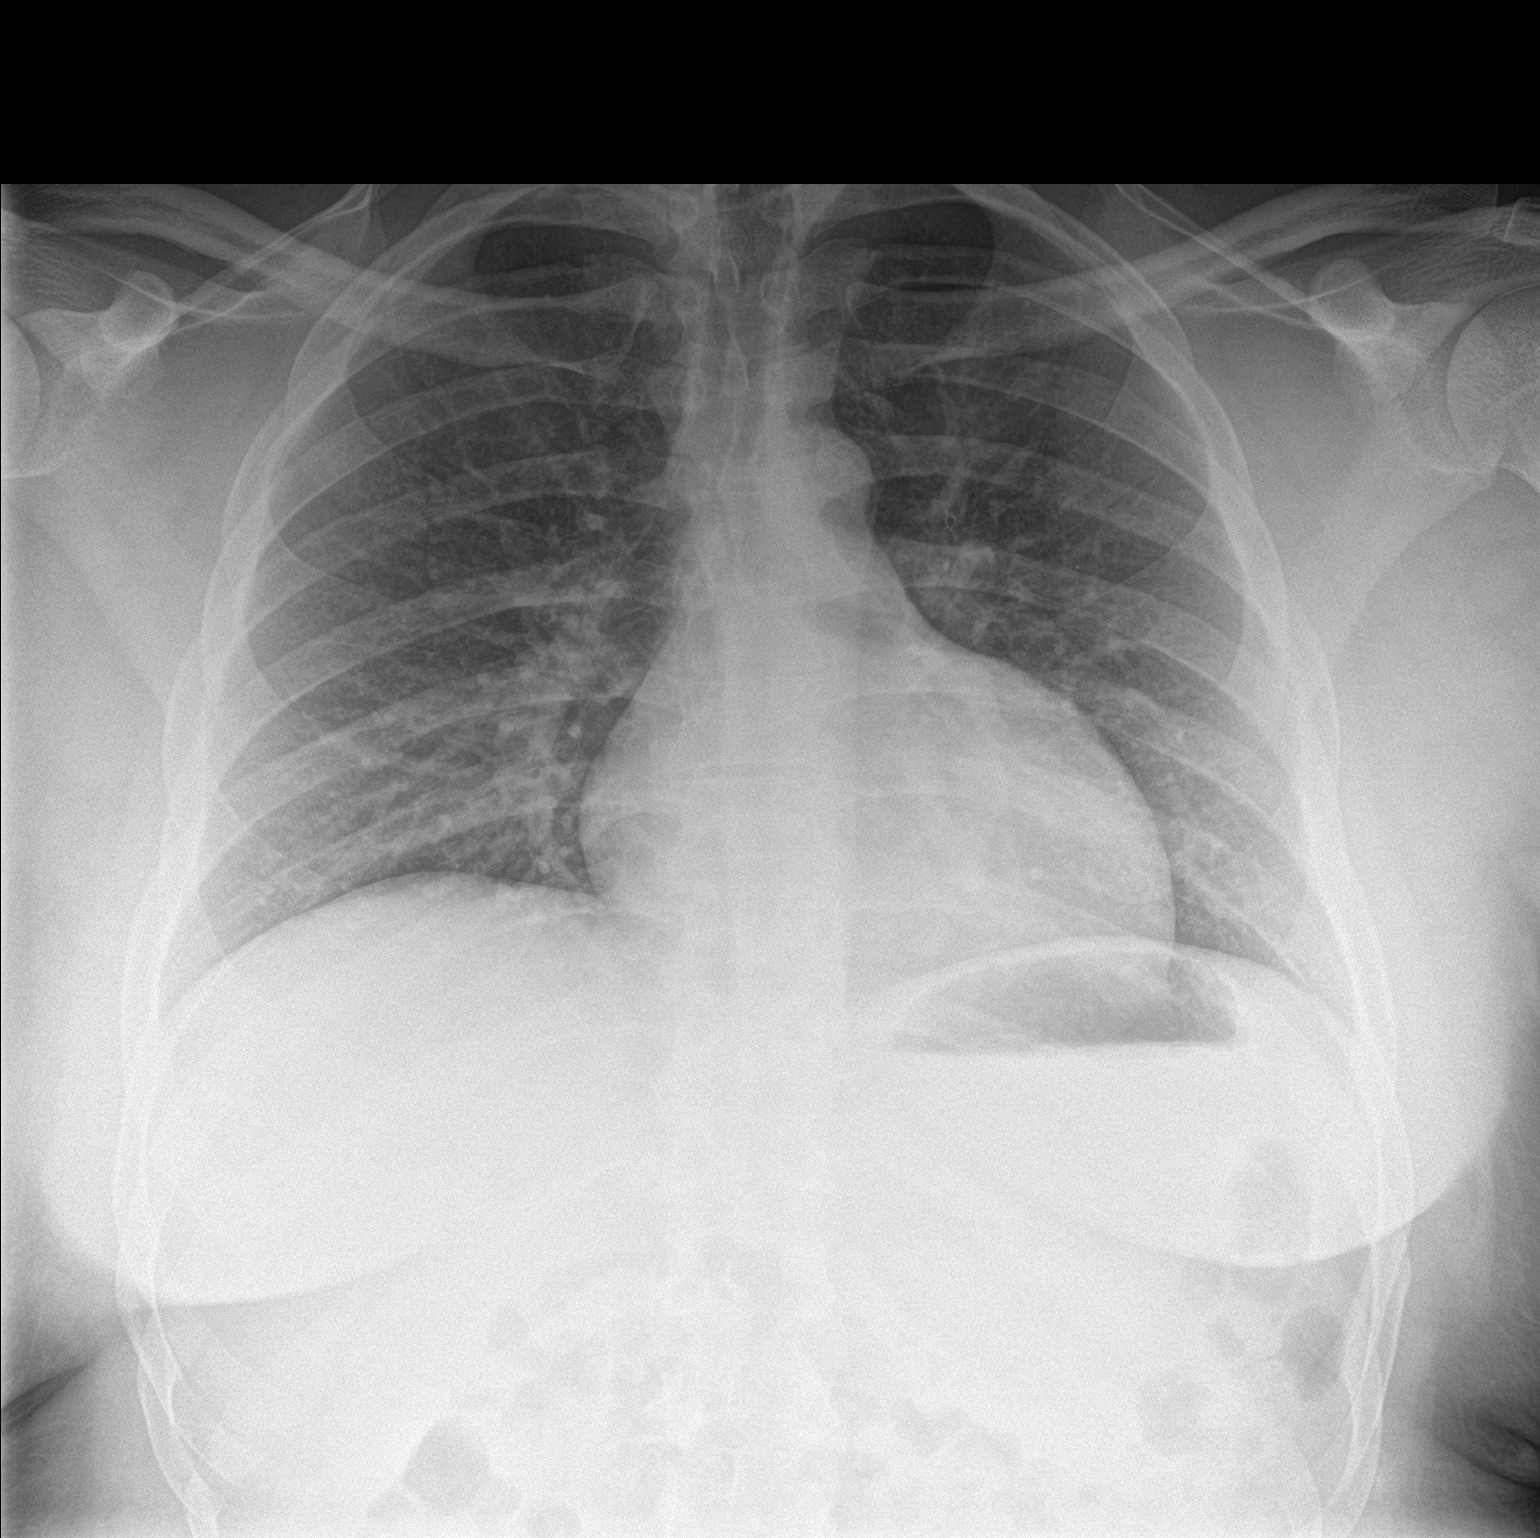

[chest lat]
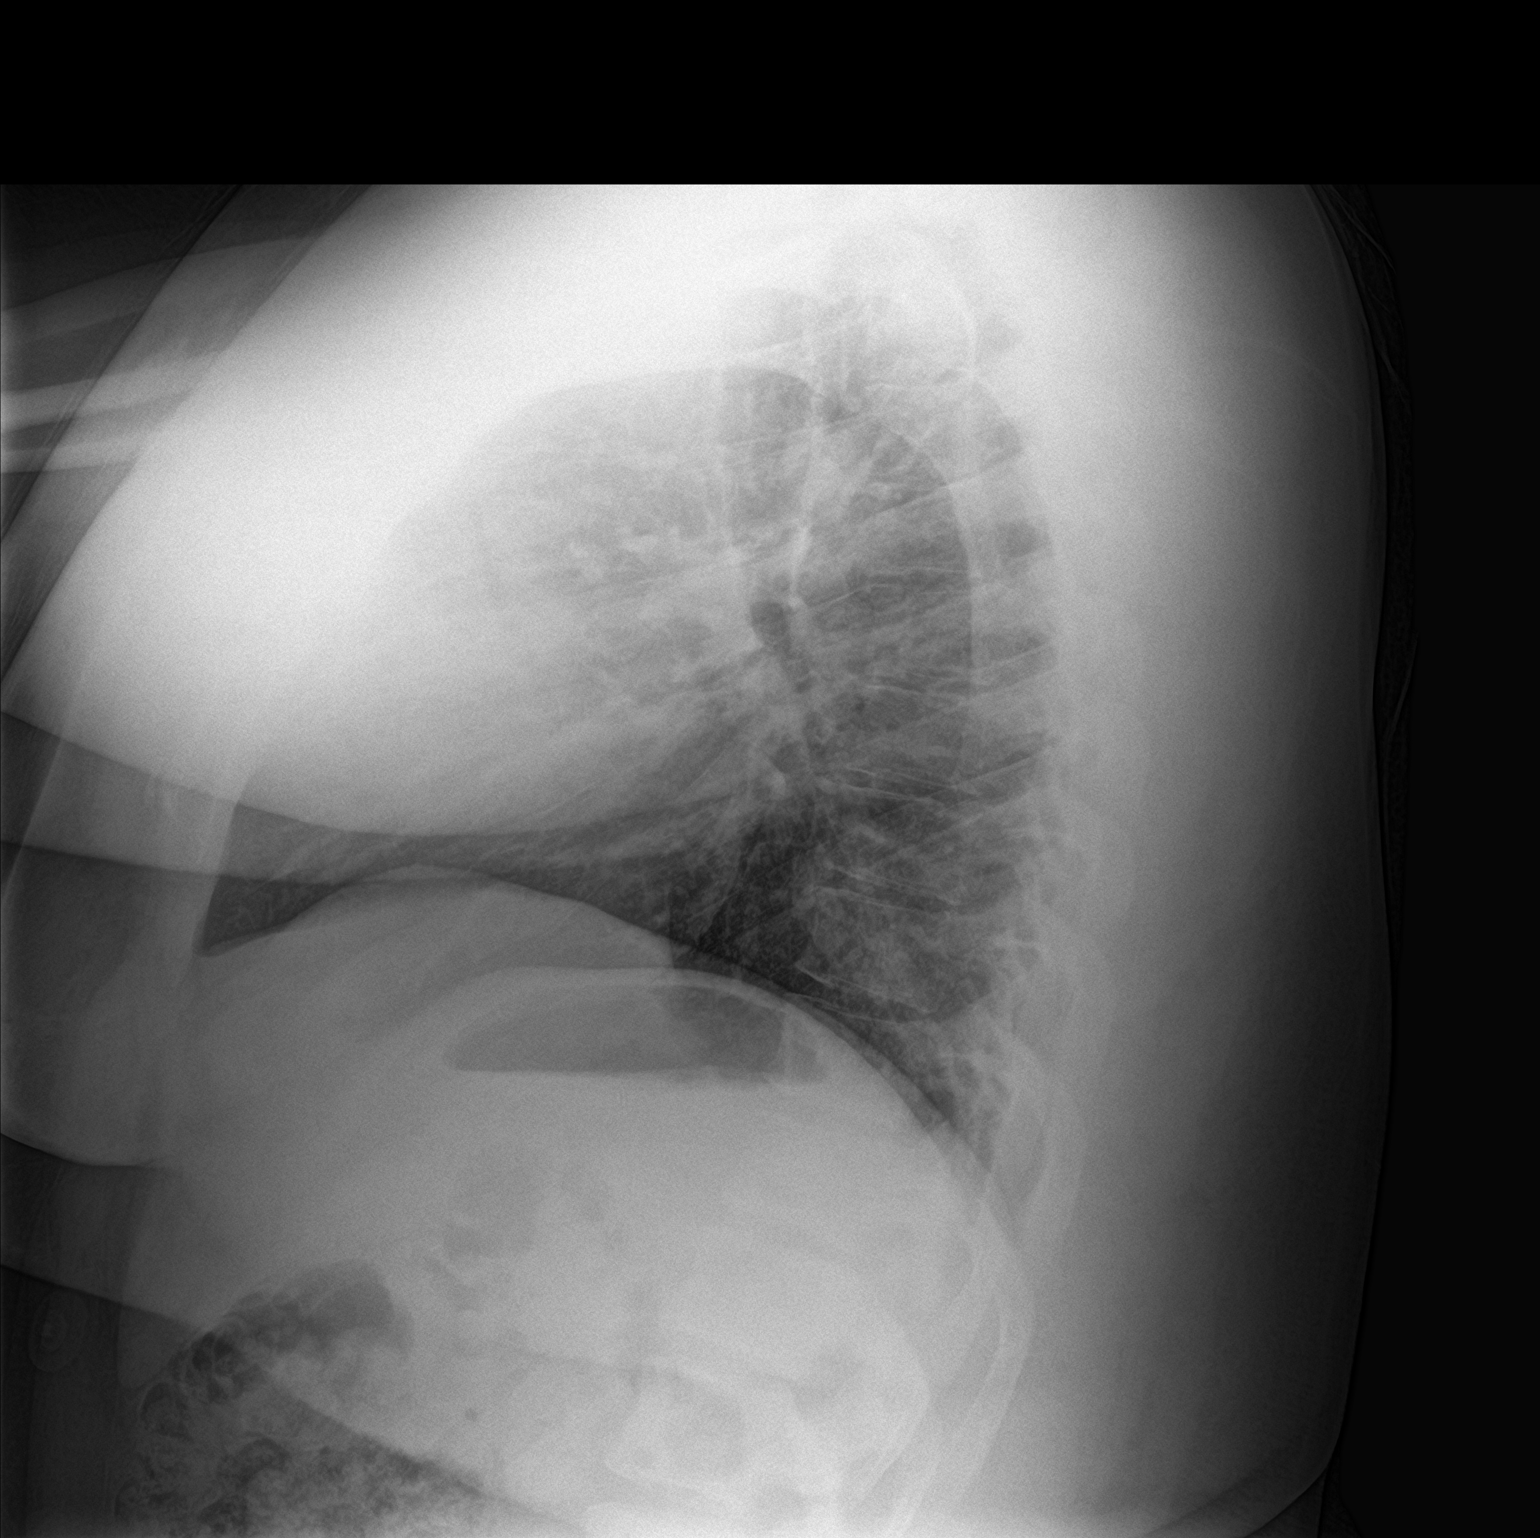

[2 of 2 positions shown; findings below may reference images not displayed]

FINDINGS: Cardiomediastinal silhouette is normal. No pleural effusions or
focal consolidations. Trachea projects midline and there is no
pneumothorax. Soft tissue planes and included osseous structures are
non-suspicious. Large body habitus.
IMPRESSION: Normal chest.

## 2018-12-02 DIAGNOSIS — I10 Essential (primary) hypertension: Secondary | ICD-10-CM | POA: Diagnosis not present

## 2018-12-02 DIAGNOSIS — Z13220 Encounter for screening for lipoid disorders: Secondary | ICD-10-CM | POA: Diagnosis not present

## 2018-12-02 DIAGNOSIS — Z0000 Encounter for general adult medical examination without abnormal findings: Secondary | ICD-10-CM | POA: Diagnosis not present

## 2018-12-02 DIAGNOSIS — Z7721 Contact with and (suspected) exposure to potentially hazardous body fluids: Secondary | ICD-10-CM | POA: Diagnosis not present

## 2018-12-02 DIAGNOSIS — E559 Vitamin D deficiency, unspecified: Secondary | ICD-10-CM | POA: Diagnosis not present

## 2018-12-05 DIAGNOSIS — I10 Essential (primary) hypertension: Secondary | ICD-10-CM | POA: Diagnosis not present

## 2018-12-05 DIAGNOSIS — E669 Obesity, unspecified: Secondary | ICD-10-CM | POA: Diagnosis not present

## 2018-12-05 DIAGNOSIS — Z113 Encounter for screening for infections with a predominantly sexual mode of transmission: Secondary | ICD-10-CM | POA: Diagnosis not present

## 2018-12-05 DIAGNOSIS — J302 Other seasonal allergic rhinitis: Secondary | ICD-10-CM | POA: Diagnosis not present

## 2018-12-14 DIAGNOSIS — B009 Herpesviral infection, unspecified: Secondary | ICD-10-CM | POA: Diagnosis not present

## 2019-01-31 DIAGNOSIS — I10 Essential (primary) hypertension: Secondary | ICD-10-CM | POA: Diagnosis not present

## 2019-01-31 DIAGNOSIS — Z6841 Body Mass Index (BMI) 40.0 and over, adult: Secondary | ICD-10-CM | POA: Diagnosis not present

## 2019-01-31 DIAGNOSIS — E559 Vitamin D deficiency, unspecified: Secondary | ICD-10-CM | POA: Diagnosis not present

## 2019-04-05 DIAGNOSIS — F321 Major depressive disorder, single episode, moderate: Secondary | ICD-10-CM | POA: Diagnosis not present

## 2019-04-24 DIAGNOSIS — I10 Essential (primary) hypertension: Secondary | ICD-10-CM | POA: Diagnosis not present

## 2019-04-24 DIAGNOSIS — M791 Myalgia, unspecified site: Secondary | ICD-10-CM | POA: Diagnosis not present

## 2019-04-24 DIAGNOSIS — J302 Other seasonal allergic rhinitis: Secondary | ICD-10-CM | POA: Diagnosis not present

## 2019-04-24 DIAGNOSIS — E669 Obesity, unspecified: Secondary | ICD-10-CM | POA: Diagnosis not present

## 2019-04-25 DIAGNOSIS — M791 Myalgia, unspecified site: Secondary | ICD-10-CM | POA: Diagnosis not present

## 2019-04-25 DIAGNOSIS — I10 Essential (primary) hypertension: Secondary | ICD-10-CM | POA: Diagnosis not present

## 2019-04-25 DIAGNOSIS — R252 Cramp and spasm: Secondary | ICD-10-CM | POA: Diagnosis not present

## 2019-04-28 DIAGNOSIS — Z719 Counseling, unspecified: Secondary | ICD-10-CM | POA: Diagnosis not present

## 2019-04-28 DIAGNOSIS — S86891A Other injury of other muscle(s) and tendon(s) at lower leg level, right leg, initial encounter: Secondary | ICD-10-CM | POA: Diagnosis not present

## 2019-04-28 DIAGNOSIS — I10 Essential (primary) hypertension: Secondary | ICD-10-CM | POA: Diagnosis not present

## 2019-05-02 DIAGNOSIS — E669 Obesity, unspecified: Secondary | ICD-10-CM | POA: Diagnosis not present

## 2019-05-02 DIAGNOSIS — Z6839 Body mass index (BMI) 39.0-39.9, adult: Secondary | ICD-10-CM | POA: Diagnosis not present

## 2019-05-02 DIAGNOSIS — I10 Essential (primary) hypertension: Secondary | ICD-10-CM | POA: Diagnosis not present

## 2019-05-02 DIAGNOSIS — E559 Vitamin D deficiency, unspecified: Secondary | ICD-10-CM | POA: Diagnosis not present

## 2019-07-21 DIAGNOSIS — E669 Obesity, unspecified: Secondary | ICD-10-CM | POA: Diagnosis not present

## 2019-07-21 DIAGNOSIS — I10 Essential (primary) hypertension: Secondary | ICD-10-CM | POA: Diagnosis not present

## 2019-07-21 DIAGNOSIS — E559 Vitamin D deficiency, unspecified: Secondary | ICD-10-CM | POA: Diagnosis not present

## 2019-07-21 DIAGNOSIS — J309 Allergic rhinitis, unspecified: Secondary | ICD-10-CM | POA: Diagnosis not present

## 2019-10-30 DIAGNOSIS — Z13220 Encounter for screening for lipoid disorders: Secondary | ICD-10-CM | POA: Diagnosis not present

## 2019-10-30 DIAGNOSIS — Z0000 Encounter for general adult medical examination without abnormal findings: Secondary | ICD-10-CM | POA: Diagnosis not present

## 2019-10-30 DIAGNOSIS — Z1159 Encounter for screening for other viral diseases: Secondary | ICD-10-CM | POA: Diagnosis not present

## 2019-11-01 DIAGNOSIS — Z23 Encounter for immunization: Secondary | ICD-10-CM | POA: Diagnosis not present

## 2019-11-01 DIAGNOSIS — R252 Cramp and spasm: Secondary | ICD-10-CM | POA: Diagnosis not present

## 2019-11-01 DIAGNOSIS — I10 Essential (primary) hypertension: Secondary | ICD-10-CM | POA: Diagnosis not present

## 2019-11-01 DIAGNOSIS — E782 Mixed hyperlipidemia: Secondary | ICD-10-CM | POA: Diagnosis not present

## 2019-11-01 DIAGNOSIS — Z0000 Encounter for general adult medical examination without abnormal findings: Secondary | ICD-10-CM | POA: Diagnosis not present

## 2019-11-28 DIAGNOSIS — M25561 Pain in right knee: Secondary | ICD-10-CM | POA: Diagnosis not present

## 2019-12-12 DIAGNOSIS — I10 Essential (primary) hypertension: Secondary | ICD-10-CM | POA: Diagnosis not present

## 2019-12-12 DIAGNOSIS — M6289 Other specified disorders of muscle: Secondary | ICD-10-CM | POA: Diagnosis not present

## 2019-12-12 DIAGNOSIS — R109 Unspecified abdominal pain: Secondary | ICD-10-CM | POA: Diagnosis not present

## 2020-01-10 DIAGNOSIS — I10 Essential (primary) hypertension: Secondary | ICD-10-CM | POA: Diagnosis not present

## 2020-01-10 DIAGNOSIS — R109 Unspecified abdominal pain: Secondary | ICD-10-CM | POA: Diagnosis not present

## 2020-03-26 DIAGNOSIS — Z7185 Encounter for immunization safety counseling: Secondary | ICD-10-CM | POA: Diagnosis not present

## 2020-03-26 DIAGNOSIS — Z6841 Body Mass Index (BMI) 40.0 and over, adult: Secondary | ICD-10-CM | POA: Diagnosis not present

## 2020-03-26 DIAGNOSIS — I10 Essential (primary) hypertension: Secondary | ICD-10-CM | POA: Diagnosis not present

## 2020-03-26 DIAGNOSIS — J302 Other seasonal allergic rhinitis: Secondary | ICD-10-CM | POA: Diagnosis not present

## 2020-04-02 DIAGNOSIS — I10 Essential (primary) hypertension: Secondary | ICD-10-CM | POA: Diagnosis not present

## 2020-04-02 DIAGNOSIS — Z6841 Body Mass Index (BMI) 40.0 and over, adult: Secondary | ICD-10-CM | POA: Diagnosis not present

## 2020-04-02 DIAGNOSIS — E782 Mixed hyperlipidemia: Secondary | ICD-10-CM | POA: Diagnosis not present
